# Patient Record
Sex: Male | Born: 1935 | Race: White | Hispanic: No | Marital: Married
Health system: Southern US, Community
[De-identification: ages and names within clinical notes are randomized; demographics above are authoritative.]

---

## 2012-05-18 ENCOUNTER — Emergency Department: Payer: Self-pay | Admitting: Emergency Medicine

## 2012-05-18 LAB — COMPREHENSIVE METABOLIC PANEL
Albumin: 3.8 g/dL (ref 3.4–5.0)
Alkaline Phosphatase: 73 U/L (ref 50–136)
Anion Gap: 5 — ABNORMAL LOW (ref 7–16)
Bilirubin,Total: 0.6 mg/dL (ref 0.2–1.0)
Calcium, Total: 8.9 mg/dL (ref 8.5–10.1)
Chloride: 107 mmol/L (ref 98–107)
Co2: 28 mmol/L (ref 21–32)
EGFR (Non-African Amer.): >60
Glucose: 147 mg/dL — ABNORMAL HIGH (ref 65–99)
Osmolality: 284 (ref 275–301)
Potassium: 3.4 mmol/L — ABNORMAL LOW (ref 3.5–5.1)
SGPT (ALT): 29 U/L (ref 12–78)
Sodium: 140 mmol/L (ref 136–145)
Total Protein: 6.7 g/dL (ref 6.4–8.2)

## 2012-05-18 LAB — CBC WITH DIFFERENTIAL/PLATELET
Basophil #: 0 10*3/uL (ref 0.0–0.1)
Eosinophil #: 0.3 10*3/uL (ref 0.0–0.7)
Eosinophil %: 4.4 %
HCT: 36.7 % — ABNORMAL LOW (ref 40.0–52.0)
Lymphocyte #: 1.4 10*3/uL (ref 1.0–3.6)
Lymphocyte %: 24.1 %
MCHC: 35.1 g/dL (ref 32.0–36.0)
Neutrophil %: 62.8 %
Platelet: 176 10*3/uL (ref 150–440)
RBC: 4.07 10*6/uL — ABNORMAL LOW (ref 4.40–5.90)
RDW: 13.5 % (ref 11.5–14.5)

## 2012-05-18 LAB — CK TOTAL AND CKMB (NOT AT ARMC)
CK, Total: 131 U/L (ref 35–232)
CK-MB: 2.5 ng/mL (ref 0.5–3.6)

## 2012-05-18 LAB — PROTIME-INR: INR: 1.1

## 2012-05-18 LAB — TROPONIN I: Troponin-I: 1.3 ng/mL — ABNORMAL HIGH

## 2012-05-29 ENCOUNTER — Inpatient Hospital Stay: Payer: Self-pay | Admitting: Family Medicine

## 2012-05-29 LAB — URINALYSIS, COMPLETE
Bacteria: NONE SEEN
Bilirubin,UR: NEGATIVE
Blood: NEGATIVE
Glucose,UR: NEGATIVE mg/dL (ref 0–75)
Ketone: NEGATIVE
Nitrite: NEGATIVE
Ph: 5 (ref 4.5–8.0)
Protein: NEGATIVE
RBC,UR: 3 /HPF (ref 0–5)
Specific Gravity: 1.044 (ref 1.003–1.030)
Squamous Epithelial: NONE SEEN
WBC UR: 4 /HPF (ref 0–5)

## 2012-05-29 LAB — COMPREHENSIVE METABOLIC PANEL
Albumin: 3.4 g/dL (ref 3.4–5.0)
Alkaline Phosphatase: 77 U/L (ref 50–136)
BUN: 13 mg/dL (ref 7–18)
Bilirubin,Total: 0.4 mg/dL (ref 0.2–1.0)
EGFR (Non-African Amer.): >60
Osmolality: 283 (ref 275–301)
Potassium: 3.8 mmol/L (ref 3.5–5.1)
SGPT (ALT): 41 U/L (ref 12–78)

## 2012-05-29 LAB — CBC
HCT: 36.8 % — ABNORMAL LOW (ref 40.0–52.0)
HGB: 13.2 g/dL (ref 13.0–18.0)
MCH: 32.5 pg (ref 26.0–34.0)
MCHC: 35.9 g/dL (ref 32.0–36.0)

## 2012-05-29 LAB — CK TOTAL AND CKMB (NOT AT ARMC)
CK, Total: 134 U/L (ref 35–232)
CK, Total: 215 U/L (ref 35–232)
CK-MB: 3.3 ng/mL (ref 0.5–3.6)
CK-MB: 7.4 ng/mL — ABNORMAL HIGH (ref 0.5–3.6)

## 2012-05-29 LAB — PROTIME-INR
INR: 1
Prothrombin Time: 13.5 secs (ref 11.5–14.7)

## 2012-05-29 LAB — TROPONIN I
Troponin-I: 1.2 ng/mL — ABNORMAL HIGH
Troponin-I: 1.32 ng/mL — ABNORMAL HIGH

## 2012-05-30 LAB — LIPID PANEL
Cholesterol: 83 mg/dL (ref 0–200)
HDL Cholesterol: 22 mg/dL — ABNORMAL LOW (ref 40–60)
Ldl Cholesterol, Calc: 42 mg/dL (ref 0–100)
VLDL Cholesterol, Calc: 19 mg/dL (ref 5–40)

## 2012-05-30 LAB — CK TOTAL AND CKMB (NOT AT ARMC)
CK, Total: 177 U/L (ref 35–232)
CK-MB: 5.8 ng/mL — ABNORMAL HIGH (ref 0.5–3.6)

## 2012-05-31 LAB — CBC WITH DIFFERENTIAL/PLATELET
Eosinophil #: 0.2 10*3/uL (ref 0.0–0.7)
Lymphocyte #: 2.1 10*3/uL (ref 1.0–3.6)
Lymphocyte %: 24.8 %
MCHC: 36 g/dL (ref 32.0–36.0)
MCV: 90 fL (ref 80–100)
Monocyte %: 7.4 %
Neutrophil #: 5.4 10*3/uL (ref 1.4–6.5)
Neutrophil %: 64.9 %
RDW: 13.4 % (ref 11.5–14.5)
WBC: 8.4 10*3/uL (ref 3.8–10.6)

## 2012-05-31 LAB — BASIC METABOLIC PANEL
Anion Gap: 6 — ABNORMAL LOW (ref 7–16)
Calcium, Total: 8.9 mg/dL (ref 8.5–10.1)
Chloride: 106 mmol/L (ref 98–107)
Co2: 28 mmol/L (ref 21–32)
Creatinine: 1.08 mg/dL (ref 0.60–1.30)
EGFR (African American): >60
Glucose: 90 mg/dL (ref 65–99)
Sodium: 140 mmol/L (ref 136–145)

## 2012-06-05 ENCOUNTER — Encounter: Payer: Self-pay | Admitting: Family Medicine

## 2012-06-16 ENCOUNTER — Encounter: Payer: Self-pay | Admitting: Family Medicine

## 2013-07-31 IMAGING — CT CT HEAD WITHOUT CONTRAST
3 series · 17 of 30 positions shown, 19 images · non-contrast
Comparison: none

REASON FOR EXAM: slurred speech
COMMENTS:

[Series 2: soft tissue · axial · 0.43mm/px · z∈[+1300,+1430]mm · 8 of 34 slices shown, 10 images (1 of 2)]
[im 4/34  brain]
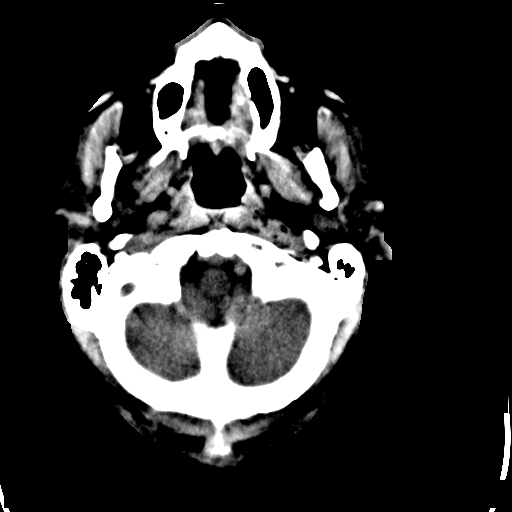
[im 4/34  bone]
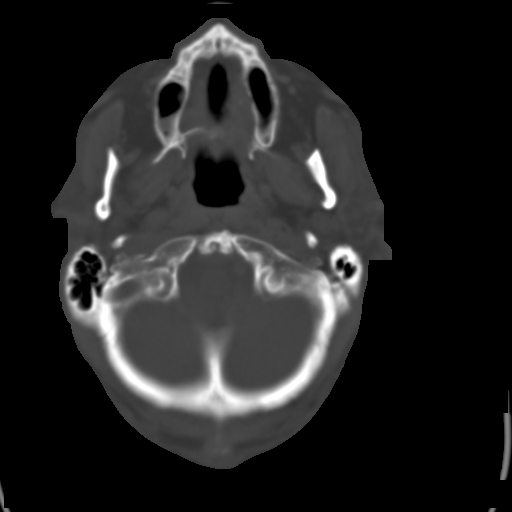
[im 8/34  brain]
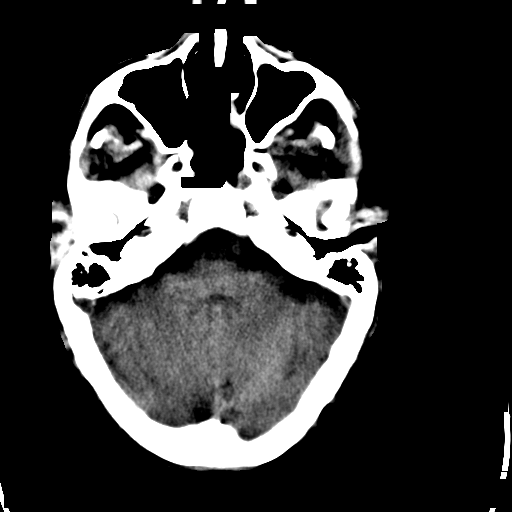
[im 12/34  brain]
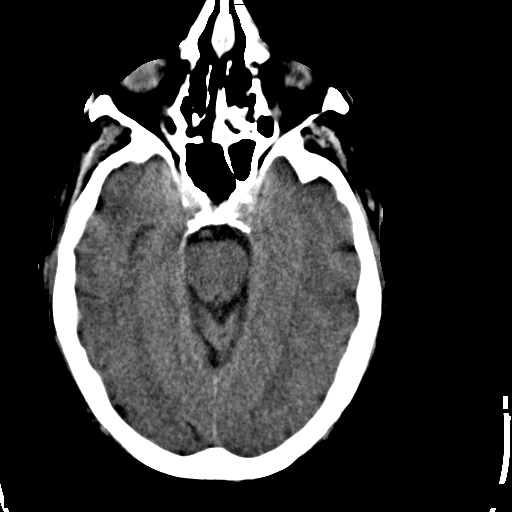
[im 15/34  brain]
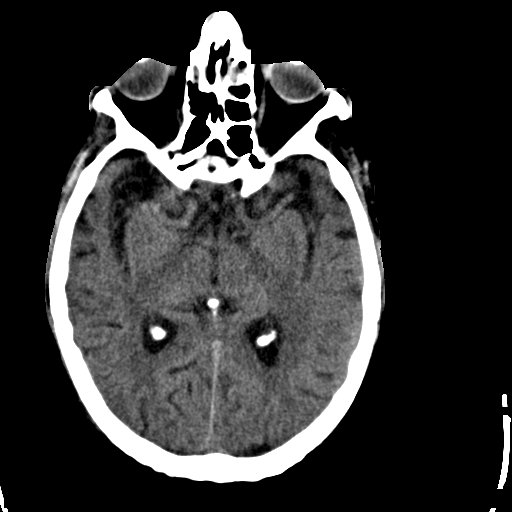
[im 19/34  brain]
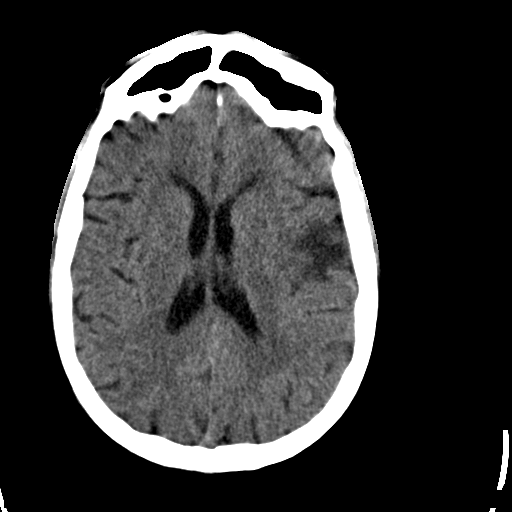
[im 19/34  bone]
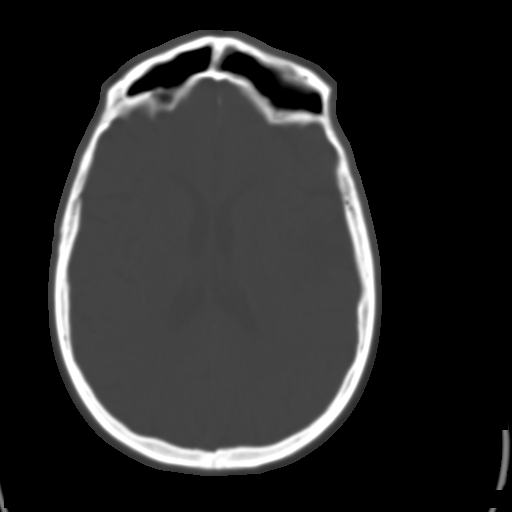
[im 23/34  brain]
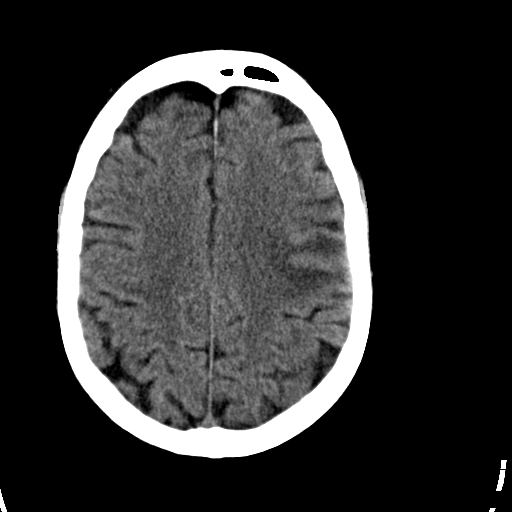
[im 26/34  brain]
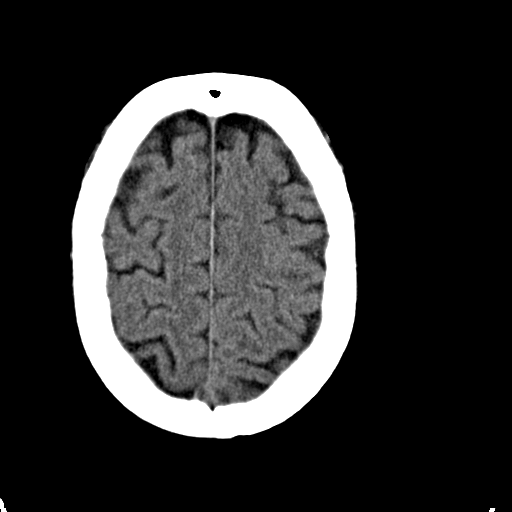
[im 30/34  brain]
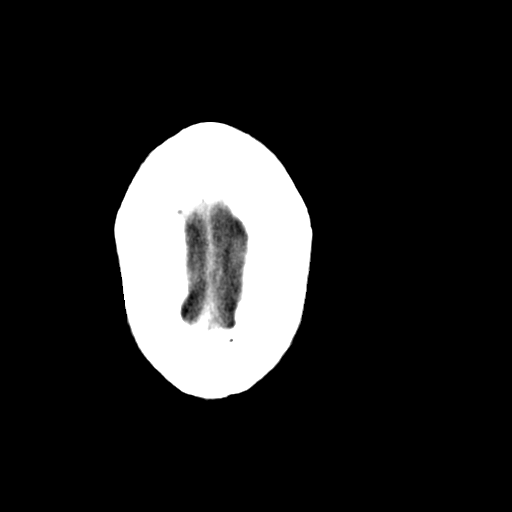

[Series 4: soft tissue · axial · 0.43mm/px · z∈[+1377,+1470]mm · 7 of 35 slices shown (2 of 2)]
[im 4/35  brain]
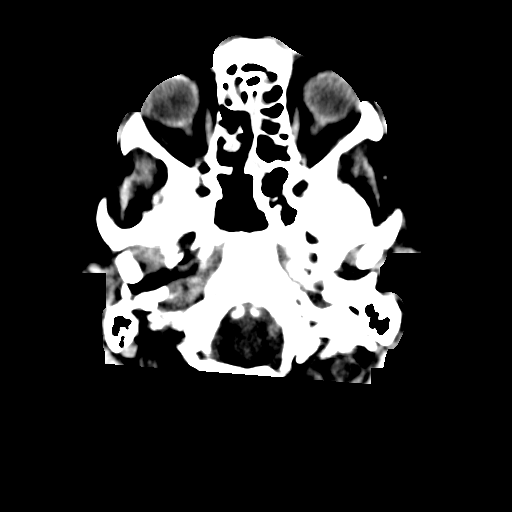
[im 8/35  brain]
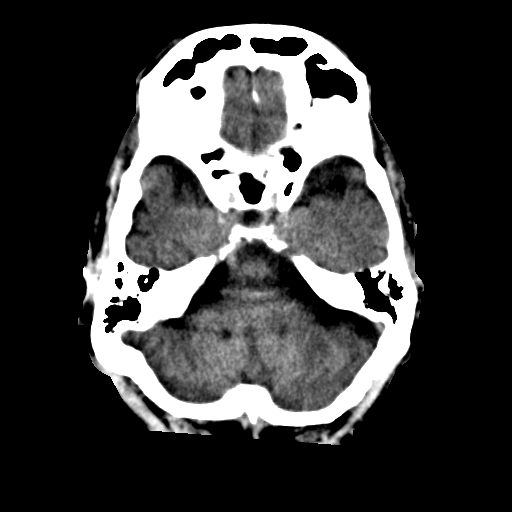
[im 12/35  brain]
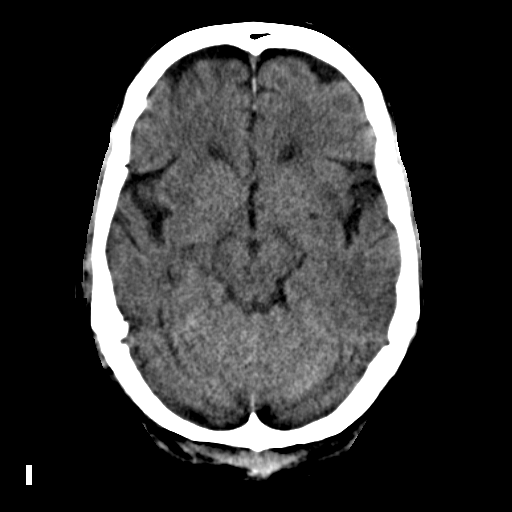
[im 16/35  brain]
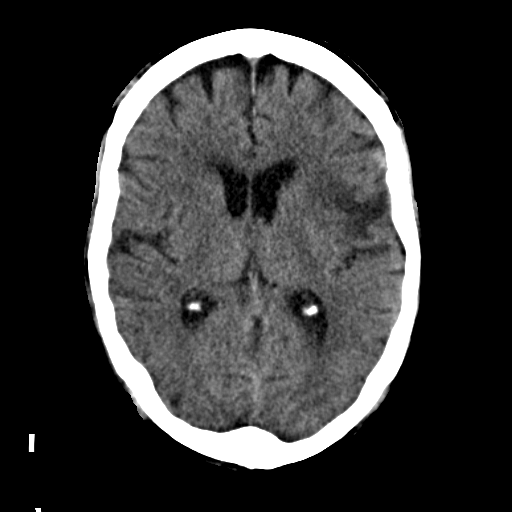
[im 19/35  brain]
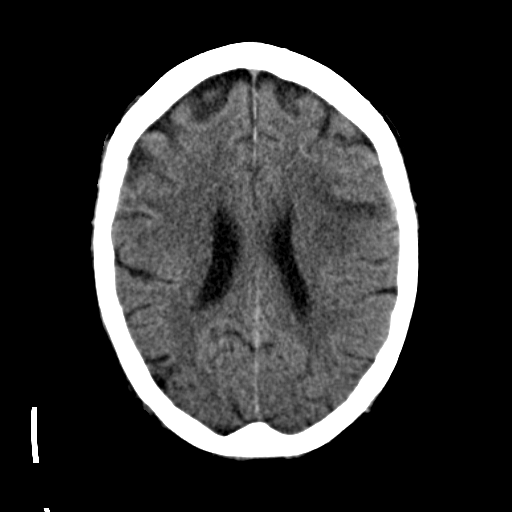
[im 23/35  brain]
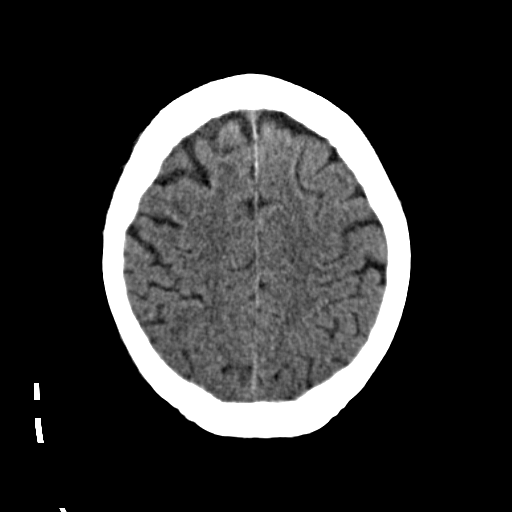
[im 27/35  brain]
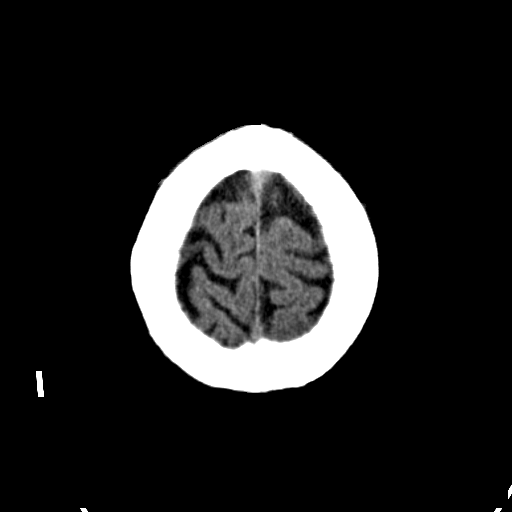

[Series 5: bone · axial · 0.43mm/px · z∈[+1383,+1400]mm · 2 of 34 slices shown]
[im 4/34  bone]
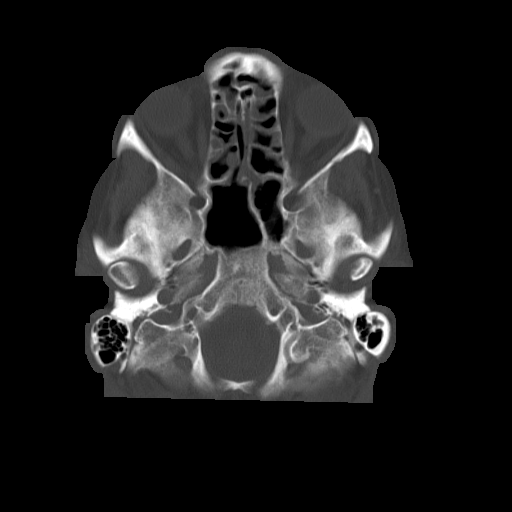
[im 8/34  bone]
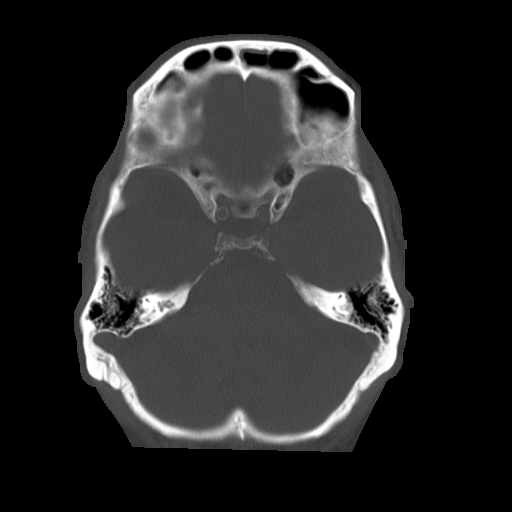

[17 of 30 positions shown; findings below may reference images not displayed]

PROCEDURE:     CT  - CT HEAD WITHOUT CONTRAST  - May 29, 2012  [DATE]

RESULT:     Axial CT scanning was performed through the brain with
reconstructions at 5 mm intervals and slice thicknesses. Comparison is made
to the study May 18, 2012.

There is abnormal hypodensity in the left posterior frontal and anterior
parietal lobes with a small amount of abnormal density extending inferiorly
into the upper aspect of the left temporal lobe. The findings are new and
are consistent with an evolving ischemic infarction. There is no
intracranial hemorrhage. There is no shift of the midline. The ventricles
are normal in size. I do not see similar findings elsewhere within the
brain. The cerebellum and brainstem exhibit no acute abnormalities.

At bone window settings there is mucoperiosteal thickening within the
ethmoid inferior aspect of the right maxillary sinus consistent with
inflammation. There is no evidence of an acute skull fracture.
IMPRESSION: 1. There is abnormal hypodensity present in the posterior frontal-anterior
parietal lobes on the left. The findings are worrisome for an evolving
ischemic infarction. The findings are new since May 18, 2012. There is no
evidence of an intracranial hemorrhage.
2. Mild inflammation in the ethmoid and right maxillary sinuses is
suspected.

[REDACTED]

## 2013-08-01 IMAGING — CT CT HEAD WITHOUT CONTRAST
1 series · 15 of 30 positions shown, 19 images · non-contrast
Comparison: none

REASON FOR EXAM: f/u CVA. Cannot get MRI due to PM.
COMMENTS:

PROCEDURE:     CT  - CT HEAD WITHOUT CONTRAST  - May 30, 2012  [DATE]
RESULT:     Comparison is made to a prior study dated 05/29/2012.
TECHNIQUE: Helical noncontrasted 5 mm sections were obtained from the skull
base through the vertex.

[Series 2: soft tissue · axial · 0.44mm/px · z∈[+394,+528]mm · 15 of 30 slices shown, 19 images]
[im 2/30  brain]
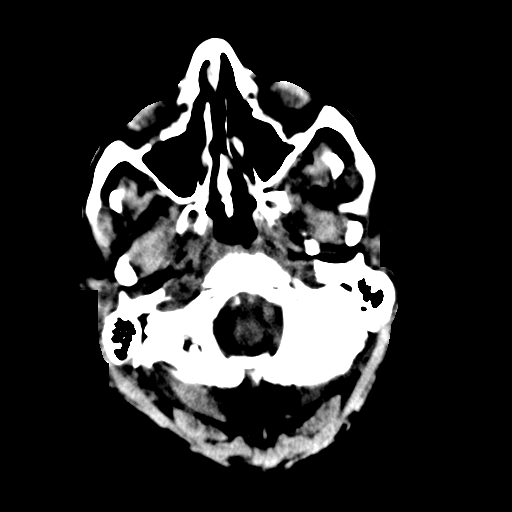
[im 2/30  bone]
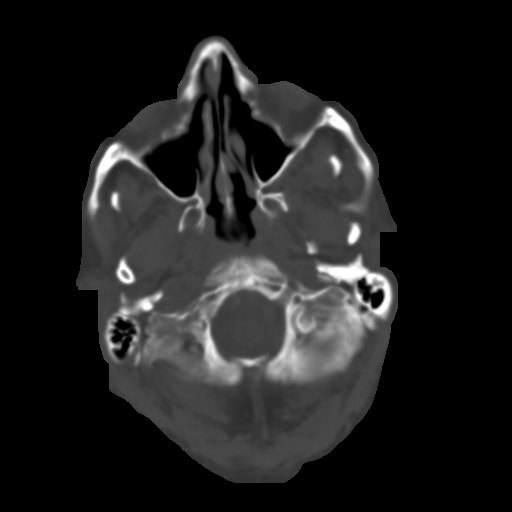
[im 4/30  brain]
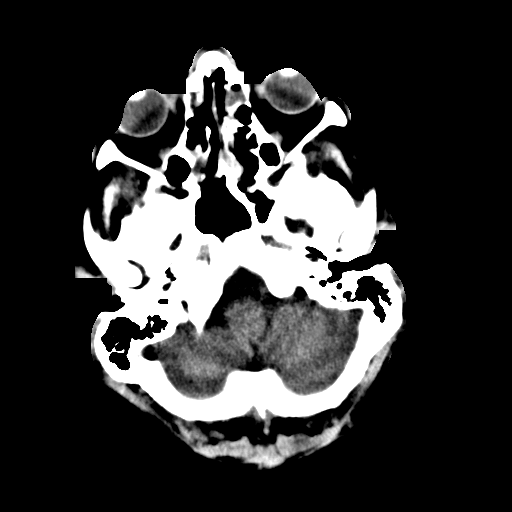
[im 6/30  brain]
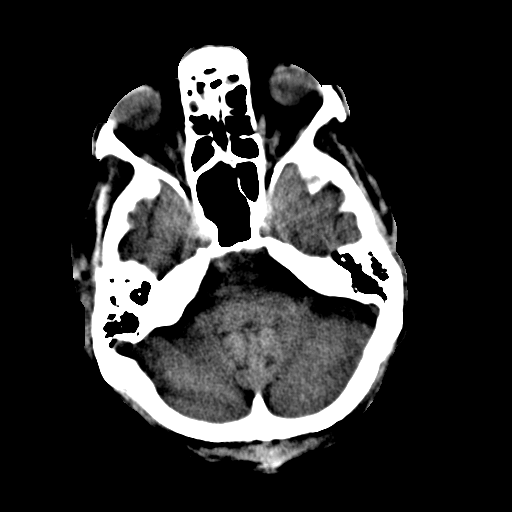
[im 8/30  brain]
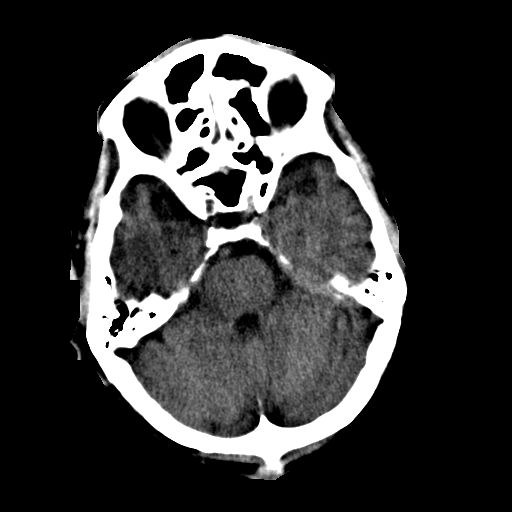
[im 10/30  brain]
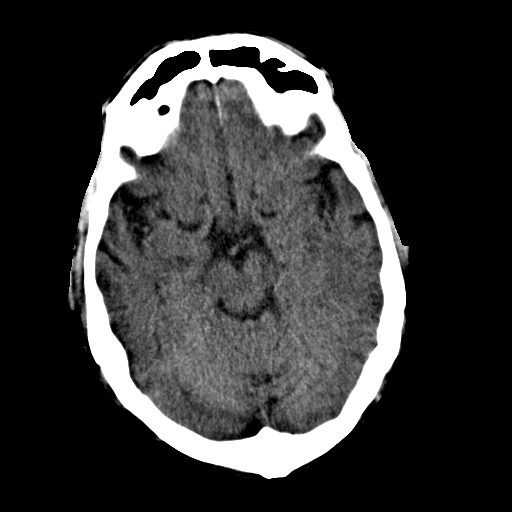
[im 10/30  bone]
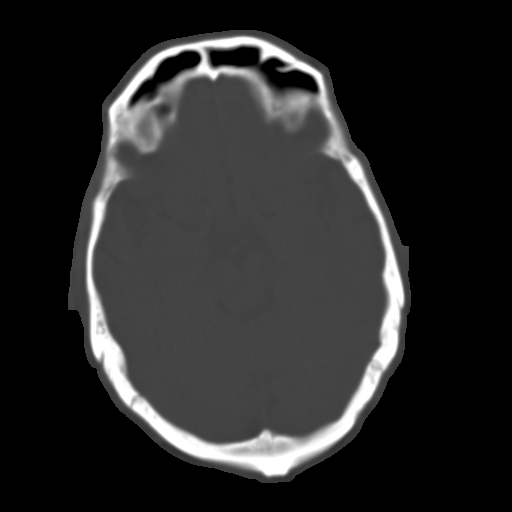
[im 12/30  brain]
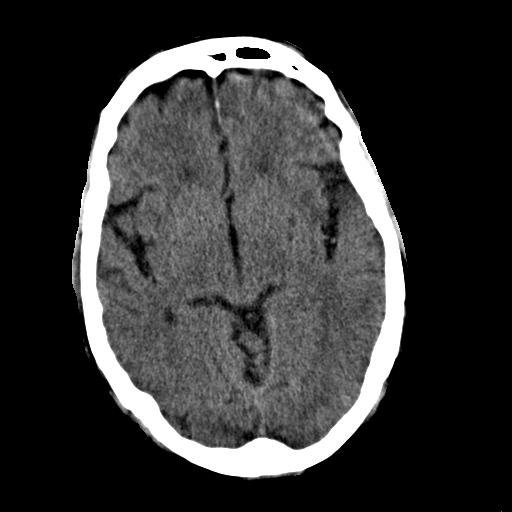
[im 14/30  brain]
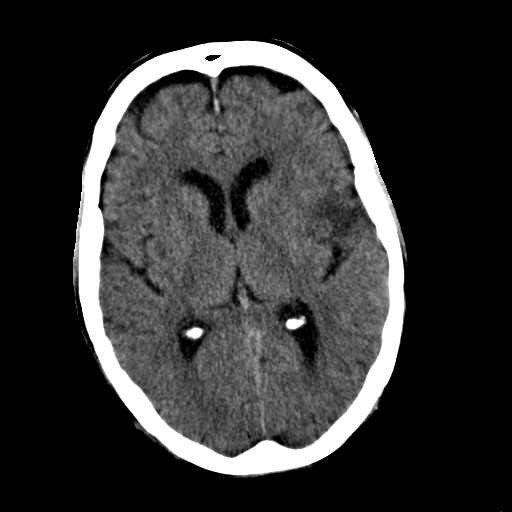
[im 16/30  brain]
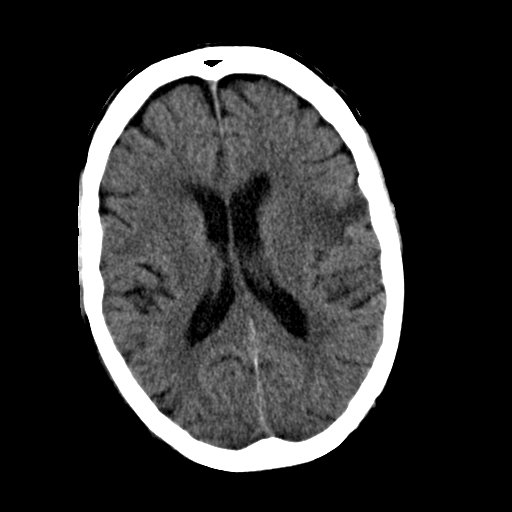
[im 17/30  brain]
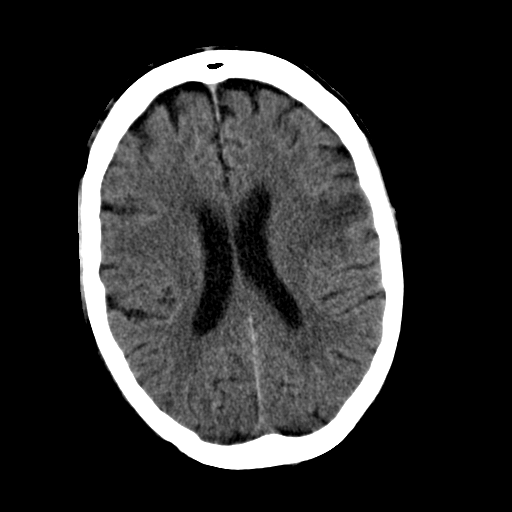
[im 17/30  bone]
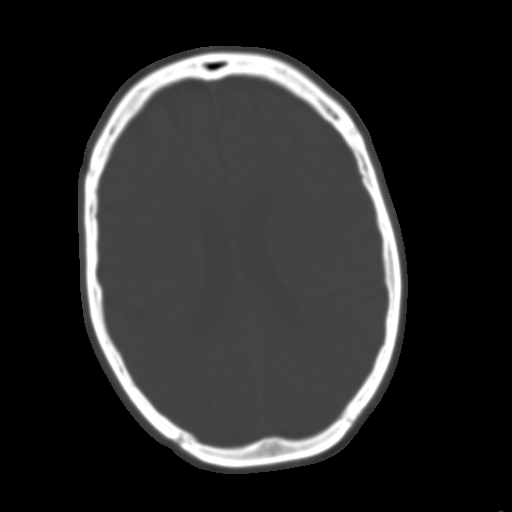
[im 19/30  brain]
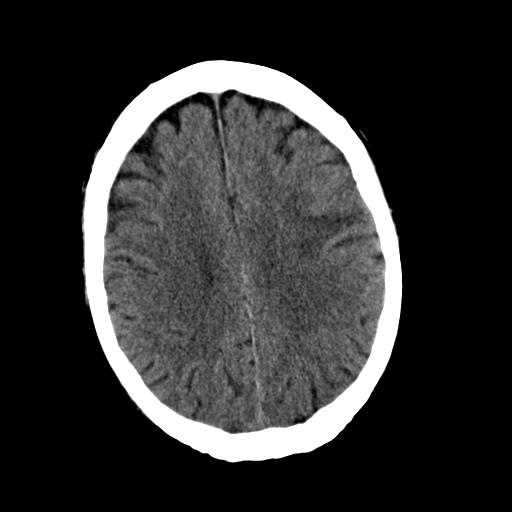
[im 21/30  brain]
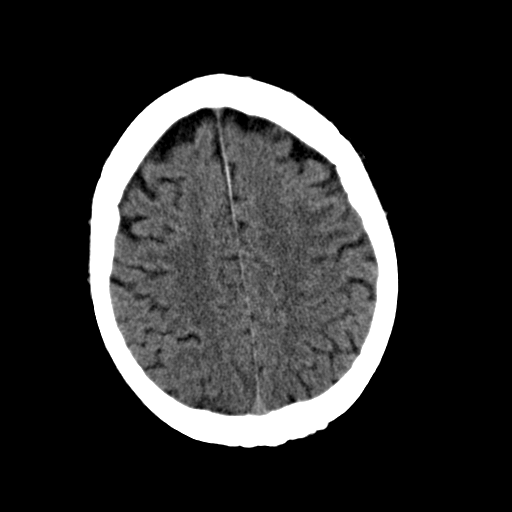
[im 23/30  brain]
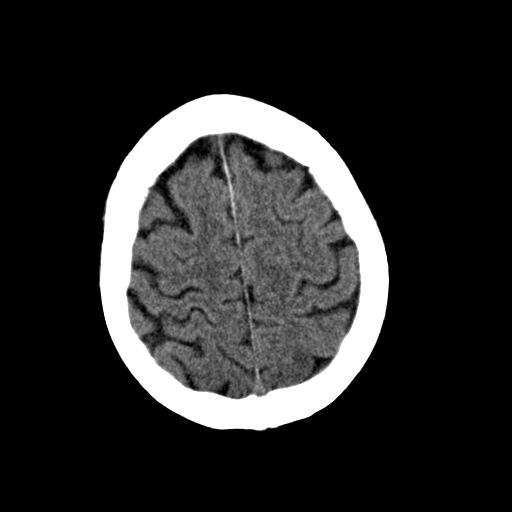
[im 25/30  brain]
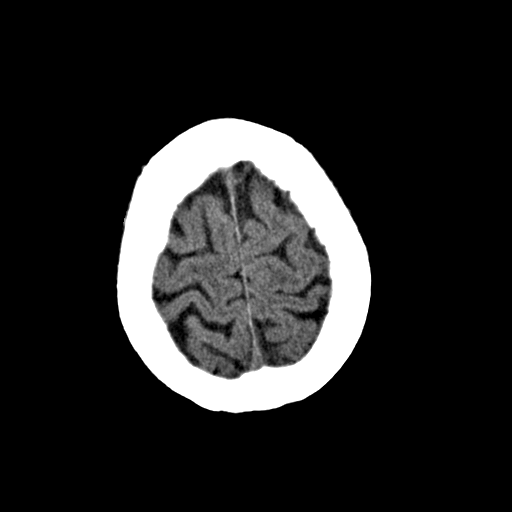
[im 25/30  bone]
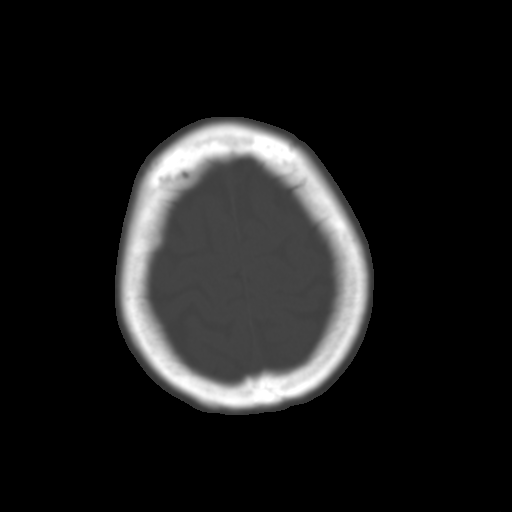
[im 27/30  brain]
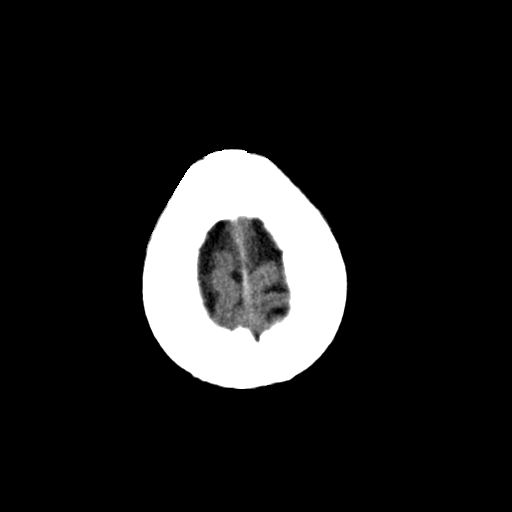
[im 29/30  brain]
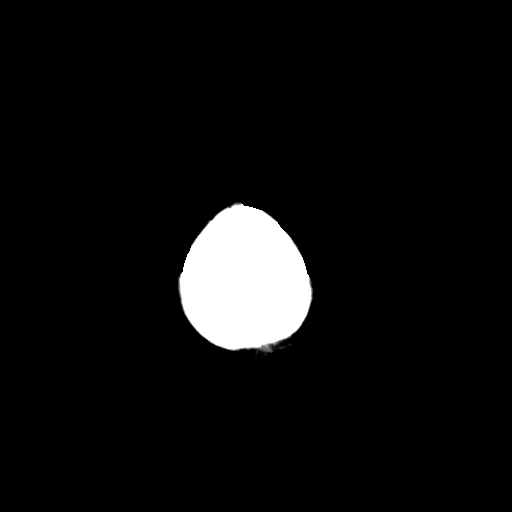

[15 of 30 positions shown; findings below may reference images not displayed]

FINDINGS: The previously described area of hypodensity within the posterior
aspect of the frontal lobe extending into the parietal region is unchanged.
Diffuse areas of low-attenuation project within the subcortical, deep, and
periventricular white matter regions, mild. There is no evidence of
subfalcine or tonsillar herniation. There is mild diffuse cortical and
cerebellar atrophy. No intra-axial nor extra-axial fluid collections are
identified nor evidence of acute hemorrhage. The osseous structures
demonstrate no evidence of a depressed skull fracture. There is mucosal
thickening and areas of opacification in the ethmoid air cells. The mastoid
air cells are patent.
IMPRESSION: Chronic and involutional changes without evidence of acute
abnormalities.

## 2014-05-08 NOTE — H&P (Signed)
PATIENT NAME:  Kurt Shepherd, Kurt Shepherd MR#:  [id number] DATE OF BIRTH:  10-Jun-1935  DATE OF ADMISSION:  05/29/2012  PRIMARY CARE PHYSICIAN: At [facility].  The family does not remember primary care physician's name.  CHIEF COMPLAINT: Bilateral lower extremity weakness and confusion.   HISTORY OF PRESENTING ILLNESS: A 79 year old Caucasian male patient with history of congestive heart failure, automatic implantable cardiac defibrillator pacemaker, coronary artery disease, diabetes, hypertension, who had a recent stroke on 05/18/2012 with aphasia and right-sided weakness who was transferred from [facility] Room to [facility] at family's request. The patient was worked up and discharged home on 05/06 with aphasia and outpatient speech therapy. The patient's right-sided weakness had resolved close to normal by the time of discharge. The family mentions that they had requested a transfer to a skilled nursing facility, but the patient was discharged home as he did not qualify for rehab with normal motor function.   Today the patient woke up with significant weakness in his lower extremities and confusion. The patient did have aphasia from his recent stroke, which seems to be unchanged but the patient presently also has new dysphagia.   The patient's CT scan from 05/18/2012 showed no acute stroke, but today there is a stroke involving left-sided temporal parietal and frontal areas read as evolving stroke by radiology. Although his CT scans from [facility] from the recent admission are not available at this time waiting on records. The patient has an AICD pacemaker and cannot get an MRI.   History could not be obtained from the patient as he has aphasia. Old records have been reviewed, and history obtained from patient's family.   PAST MEDICAL HISTORY: 1.  Recent acute CVA on 05/18/2012.  2.  Congestive heart failure.  3.  Diabetes mellitus.  4.  Hypertension.  5.  Status post pacemaker/AICD.   6.  Surgery for nephrolithiasis.   SOCIAL HISTORY: The patient used to chew tobacco until his recent stroke. No alcohol. No illicit drugs. The patient lives with his wife and was independent and had an active lifestyle until his recent stroke.   CODE STATUS: Full code.   FAMILY HISTORY: No family history of strokes, coronary artery disease or cerebral aneurysm.   REVIEW OF SYSTEMS: Unobtainable as the patient has expressive aphasia. Please see history of present illness.   ALLERGIES:  PENICILLIN WHICH CAUSES HIVES.  HOME MEDICATIONS: 1.  Atorvastatin 80 mg oral once a day.  2.  Digoxin 01.25 mg oral once a day.  3.  Enalapril 10 mg 3 tablets oral 2 times a day.  4.  Flonase 50 mcg nasal once a day.  5.  Lasix 40 mg oral once a day.  6.  Glimepiride2 mg gram oral once a day.  7.  Omeprazole 20 mg oral once a day.  8.  Temazepam 30 mg oral once a day at bedtime for sleep.  9.  Aspirin 81 mg oral once a day.  10.  Coreg 25 mg oral 2 times a day. 11.  Claritin 10 mg oral once a day as needed for allergies.   PHYSICAL EXAMINATION: VITAL SIGNS: Temperature 98.5, pulse of 72, blood pressure 187/104 with saturations of 96% on room air.  GENERAL: Obese, Caucasian male patient lying in bed, restless, seems a little confused, aphasic. PSYCHIATRIC: Alert.  Orientation cannot be tested. Anxious.  HEENT: Atraumatic, normocephalic. Oral mucosa moist and pink. External ears and nose normal. No pallor. No icterus. Pupils bilaterally equal and reactive to  light.  NECK: Supple. No thyromegaly. No palpable lymph nodes. Trachea midline. No carotid bruit or JVD.   CARDIOVASCULAR: S1, S2 heard, a systolic murmur. No edema. Peripheral pulses 2+.  RESPIRATORY: Normal work of breathing. Clear to auscultation on both sides.  GASTROINTESTINAL: Soft abdomen, nontender. Bowel sounds present. No hepatosplenomegaly palpable.  SKIN: Warm and dry. No petechiae, rash, ulcers.  GENITOURINARY: No CVA tenderness or  bladder distention.  MUSCULOSKELETAL: No joint swelling, redness, effusion of the large joints. Normal muscle tone.  NEUROLOGICAL: Motor strength 5/5 in upper extremities and  4+/5 in the left lower extremity and 4/5 in right lower extremity. Babinski upgoing on the right side. Cerebellar function intact. He has right-sided facial droop and expressive aphasia. Follows commands.  LYMPHATIC: No cervical lymphadenopathy.   LABORATORY AND DIAGNOSTIC DATA:  Glucose 125, BUN 13, creatinine 0.99, sodium 141, potassium 3.8, chloride 109. AST, ALT, alkaline phosphatase, bilirubin normal. CK is 134. Troponin 1.2. WBC 4.8, hemoglobin 13.2, platelets 190, INR of 1.  EKG shows a paced rhythm.   CT scan of the head without contrast shows abnormal hypodensity present in the posterior, frontal, anterior, parietal lobes on the left worrisome for evolving ischemic stroke. Findings are new since 05/03.  Mild inflammation in the ethmoid and right maxillary sinus.   ASSESSMENT AND PLAN:   1.  The patient is a 79 year old male patient with an evolving left-sided frontal parietal temporal stroke who had a recent stroke with worsening symptoms. The patient is critically ill with new stroke.  Neurologic symptoms are beyond the 3-hour window. The family has requested that the patient be admitted [facility]. The patient cannot get an MRI secondary to with the pacemaker. We will get a repeat CT of scan of the head in 24 hours. We will also get a CTA of head and neck to look for any arterial occlusions and narrowing which may need intervention. Consult neurology, Dr. Sherryll BurgerShah, neuro checks q.4 hours. The patient will be on fall precautions and ambulate with assist. We will start the patient on Plavix in addition to his aspirin, check a fasting lipid profile. Continue the atorvastatin.  We will hold his blood pressure medications. Let his blood pressure run on elevated secondary to the acute  evolving stroke. I have explained to the family that if any intervention is needed which is not available at [facility] the [facility] and they are in agreement. We will have physical therapy occupational therapy and speech therapy see the patient.  The patient will be nothing by mouth secondary to his dysphagia until he gets a speech evaluation. [facility]  2.  Elevated troponin likely secondary from the stroke. The patient has a paced rhythm on EKG, did have elevated troponin during his recent visit to the ER with his previous stroke, MB is negative. We will consult cardiology to see the patient in case the patient is having a non-ST elevation myocardial infarction, although I doubt.  The patient is on aspirin, statin and beta blocker, Coreg.  3.  Hypertension, elevated secondary to the stroke.  4.  Hyperlipidemia. Check fasting lipid profile. Continue atorvastatin.  5.  Congestive heart failure. The patient does not seem fluid overloaded; we will hold his Lasix at this time and as the patient is n.p.o. and watch for any fluid overload.  6.  Deep vein thrombosis prophylaxis with Lovenox. No bleed found on  the CT scan of the head.  7.  CODE STATUS: Full code.   Critical care time spent today on this case with acute evolving stroke was 50 minutes.    ____________________________ Molinda Bailiff Hai Grabe, MD srs:ct D: 05/29/2012 11:38:30 ET T: 05/29/2012 12:22:17 ET JOB#: [id number]  cc: Wardell Heath R. Nasario Czerniak, MD, <Dictator> [facility] K. Sherryll Burger, MD  Orie Fisherman MD ELECTRONICALLY SIGNED 05/29/2012 14:03

## 2014-05-08 NOTE — Consult Note (Signed)
General Aspect Kurt Shepherd is a 79yo male with PMHx s/f chronic systolic CHF (s/p ICD), CAD, type 2 DM, HTN and recent CVA on 05/18/12 manifested as aphasia and R-sided weakness who re-presented today with worsening R-sided weakness and dysphasia.   He was transferred from [facility] to [facility] at the request of the family for further work-up. Troponin-I at that time was 1.30. Head CT revealed no evidence of ischemia. He was diagnosed with CVA and discharged from [facility] last week. This morning, he had worsened R-sided weakness, slurred speech and facial droop. Repeat CT scan in the ED today reveals new left-sided frontal and parietal lobe ischemia. Troponin-I 1.20. Cardiology has been consulted for elevated troponin-I to exclude NSTEMI.   Present Illness He denies recent exertional chest pain. He does have chronic SOB (able to work in the yard in 15-minute intervals before becoming dyspneic). No worsening DOE, LE edema, PND, orthopnea, weight increase, new cough, palpitations, lightheadedness or syncope. He has had a cardiac catheterization > 10 years ago which did reveal CAD. No PCI performed per family. He has had stress testing over 5 years ago which was "normal." He denies history of arrhythmias. He follows up regularly with a cardiologist at [facility]. Suspect ICD is Bi-V reviewing A-V paced EKG.   PAST MEDICAL HISTORY: 1.  Recent acute CVA on 05/18/2012.  2.  Congestive heart failure.  3.  Diabetes mellitus.  4.  Hypertension.  5.  Status post pacemaker/AICD.  6.  Surgery for nephrolithiasis.   SOCIAL HISTORY: The patient used to chew tobacco until his recent stroke. No alcohol. No illicit drugs. The patient lives with his wife and was independent and had an active lifestyle until his recent stroke.   FAMILY HISTORY: No family history of strokes, coronary artery disease or cerebral aneurysm.   Physical Exam:  GEN no acute distress, thin   HEENT PERRL, poor dentition   NECK supple  No masses  trachea  midline   RESP normal resp effort  clear BS  no use of accessory muscles   CARD Regular rate and rhythm  Normal, S1, S2  No murmur   ABD soft  normal BS   EXTR negative cyanosis/clubbing, negative edema   SKIN normal to palpation   NEURO follows commands, dysphasic, diffuse incoordination, R-sided weakness   PSYCH alert, A+O to time, place, person   Review of Systems:  Subjective/Chief Complaint weakness, dysphasia, incoordination   General: Weakness   Respiratory: No Complaints   Cardiovascular: No Complaints   Neurologic: dysphasia, weakness, incoordination     s/p ICD:    CAD:    CVA May 3rd 2014:    DM:    HTN:    CHF:   Home Medications: Medication Instructions Status  digoxin 125 mcg (0.125 mg) oral tablet 1 tab(s) orally once a day Active  temazepam 30 mg oral capsule 1 cap(s) orally once a day (at bedtime), As Needed - for sleep Active  glimepiride 2 mg oral tablet 1 tab(s) orally once a day Active  furosemide 40 mg oral tablet 1 tab(s) orally once a day Active  carvedilol 25 mg oral tablet 1 tab(s) orally 2 times a day Active  atorvastatin 80 mg oral tablet 1 tab(s) orally once a day (at bedtime) Active  Flonase 50 mcg/inh nasal spray 1 spray(s) nasal once a day Active  Claritin 10 mg oral tablet 1 tab(s) orally once a day, As Needed for allergies Active  omeprazole 20 mg oral delayed release capsule 1  cap(s) orally once a day Active  enalapril 10 mg oral tablet 3 tab(s) orally 2 times a day Active  aspirin 81 mg oral delayed release tablet 1 tab(s) orally once a day Active   Lab Results:  Hepatic:  14-May-14 08:42   Bilirubin, Total 0.4  Alkaline Phosphatase 77  SGPT (ALT) 41  SGOT (AST) 27  Total Protein, Serum 6.4  Albumin, Serum 3.4  Routine Chem:  14-May-14 08:42   Glucose, Serum  125  BUN 13  Creatinine (comp) 0.99  Sodium, Serum 141  Potassium, Serum 3.8  Chloride, Serum  109  CO2, Serum 27  Calcium (Total), Serum 8.7   Osmolality (calc) 283  eGFR (African American) >60  eGFR (Non-African American) >60 (eGFR values <59m/min/1.73 m2 may be an indication of chronic kidney disease (CKD). Calculated eGFR is useful in patients with stable renal function. The eGFR calculation will not be reliable in acutely ill patients when serum creatinine is changing rapidly. It is not useful in  patients on dialysis. The eGFR calculation may not be applicable to patients at the low and high extremes of body sizes, pregnant women, and vegetarians.)  Anion Gap  5  Result Comment TROPONIN - RESULTS VERIFIED BY REPEAT TESTING.  - CALLED RESULT TO DENIA ROYSTER AT  - [id number]  - READ-BACK PROCESS PERFORMED.  Result(s) reported on 29 May 2012 at 09:39AM.  Cardiac:  14-May-14 08:42   CK, Total 134  CPK-MB, Serum 3.3 (Result(s) reported on 29 May 2012 at 09:32AM.)  Troponin I  1.20 (0.00-0.05 0.05 ng/mL or less: NEGATIVE  Repeat testing in 3-6 hrs  if clinically indicated. >0.05 ng/mL: POTENTIAL  MYOCARDIAL INJURY. Repeat  testing in 3-6 hrs if  clinically indicated. NOTE: An increase or decrease  of 30% or more on serial  testing suggests a  clinically important change)  Routine UA:  14-May-14 14:01   Color (UA) Yellow  Clarity (UA) Clear  Glucose (UA) Negative  Bilirubin (UA) Negative  Ketones (UA) Negative  Specific Gravity (UA) 1.044  Blood (UA) Negative  pH (UA) 5.0  Protein (UA) Negative  Nitrite (UA) Negative  Leukocyte Esterase (UA) Trace (Result(s) reported on 29 May 2012 at 02:24PM.)  RBC (UA) 3 /HPF  WBC (UA) 4 /HPF  Bacteria (UA) NONE SEEN  Epithelial Cells (UA) NONE SEEN  Mucous (UA) PRESENT (Result(s) reported on 29 May 2012 at 02:24PM.)  Routine Coag:  14-May-14 08:42   Prothrombin 13.5  INR 1.0 (INR reference interval applies to patients on anticoagulant therapy. A single INR therapeutic range for coumarins is not optimal for all indications; however, the suggested range for  most indications is 2.0 - 3.0. Exceptions to the INR Reference Range may include: Prosthetic heart valves, acute myocardial infarction, prevention of myocardial infarction, and combinations of aspirin and anticoagulant. The need for a higher or lower target INR must be assessed individually. Reference: The Pharmacology and Management of the Vitamin K  antagonists: the seventh ACCP Conference on Antithrombotic and Thrombolytic Therapy. CBTDVV.6160Sept:126 (3suppl): [id number] A HCT value >55% may artifactually increase the PT.  In one study,  the increase was an average of 25%. Reference:  "Effect on Routine and Special Coagulation Testing Values of Citrate Anticoagulant Adjustment in Patients with High HCT Values." American Journal of Clinical Pathology 2006;126:400-405.)  Routine Hem:  14-May-14 08:42   WBC (CBC) 4.8  RBC (CBC)  4.06  Hemoglobin (CBC) 13.2  Hematocrit (CBC)  36.8  Platelet Count (CBC) 190 (Result(s) reported on 29 May 2012 at 09:03AM.)  MCV 91  MCH 32.5  MCHC 35.9  RDW 13.7   EKG:  Interpretation A-paced, V-paced   EKG Comparision Not changed from  05/18/12   Radiology Results: CT:    14-May-14 08:51, CT Head Without Contrast  CT Head Without Contrast   REASON FOR EXAM:    slurred speech  COMMENTS:       PROCEDURE: CT  - CT HEAD WITHOUT CONTRAST  - May 29 2012  8:51AM     RESULT: Axial CT scanning was performed through the brain with   reconstructions at 5 mm intervals and slice thicknesses. Comparison is   made to the study of May 18, 2012.    There is abnormal hypodensity in the left posterior frontal and anterior   parietal lobes with a small amount of abnormal density extending   inferiorly into the upper aspect of the left temporal lobe. Thefindings   are new and are consistent with an evolving ischemic infarction. There is   no intracranial hemorrhage. There is no shift of the midline. The   ventricles are normal in size. I do not see similar  findings elsewhere     within the brain. The cerebellum and brainstem exhibit no acute   abnormalities.     At bone window settings there is mucoperiosteal thickening within the   ethmoid inferior aspect of the right maxillary sinus consistent with   inflammation. There is no evidence of an acute skull fracture.    IMPRESSION:    1. There is abnormal hypodensity present in the posterior   frontal-anterior parietal lobes on the left. The findings are worrisome   for an evolving ischemic infarction. The findings are new since May 18 2012. There is no evidence of an intracranial hemorrhage.  2. Mild inflammation in the ethmoid and right maxillary sinuses is   suspected.   Dictation Site: 1        Verified By: DAVID A. Martinique, M.D., MD    14-May-14 12:56, CT Angiography Head/Neck W/WO Combo  CT Angiography Head/Neck W/WO Combo   REASON FOR EXAM:    Acute cva. Worsening.  COMMENTS:       PROCEDURE: CT  - CT ANGIOGRAPHY HEAD NECK W/WO  - May 29 2012 12:56PM     RESULT: Indication: worsening acute cva    Comparisons: None    Technique: 100 ml of Isovue 370 was administered and the extracranial   carotid arteries bilaterally were scanned during the arterial phase from   the level of the superior portion of the frontal sinuses to the proximal   neck. These images were then transferred to the Siemens work station and   were subsequently reviewed utilizing 3-D reconstructions and MIP images.  Findings:     Intracranial circulation/circle-of-Willis:    There is low attenuation in the left parietal lobe most concerning for an   acute-subacute infarct.    Anterior: The internal carotid is identified and is patent bilaterally   from the skull base to the bifurcation into the middle cerebral and   anterior cerebral arteries. No posterior communicating arteries are   visualized. The anterior cerebral artery and anterior communicating   artery are normal.    Posterior: The vertebral  arteries are codominant and normal bilaterally   from the base of the skull to their confluence at the basilar artery. The     posterior cerebral arteries, superior cerebellar arteries, AICAs, and   PICAs are demonstrated and  are unremarkable.    Neck:    A three-vessel configuration of the aortic arch is identified with normal   ostium. The vertebral arteries are identified arising from the subclavian   arteries and entering the transverseforamen bilaterally at C6.     The carotid arteries are identified and are symmetric and unremarkable.   There is no evidence of aneurysm or carotid dissection bilaterally.     Right Carotid Artery: There is mild left heart plaque within the right   carotid bulb. There is no focal stenosis.  Left Carotid Artery: There is mild after sclerotic plaque within the left   carotid bulb without focal stenosis.    The visualized portions of the brain are unremarkable.     There is mild degenerative disc disease at C3-C4, C5-C6 and C6-C7.    The lung apices are clear.    IMPRESSION:     1. No carotid artery stenosis.    2. No intracranial stenosis.  3. Acute-subacute left parietal infarct.    Dictation Site: 1        Verified By: Jennette Banker, M.D., MD    Penicillin: Hives  Vital Signs/Nurse's Notes: **Vital Signs.:   14-May-14 14:32  Vital Signs Type Admission  Temperature Temperature (F) 98  Celsius 36.6  Temperature Source oral  Pulse Pulse 62  Respirations Respirations 18  Systolic BP Systolic BP 540  Diastolic BP (mmHg) Diastolic BP (mmHg) 90  Mean BP 118  Pulse Ox % Pulse Ox % 96  Pulse Ox Activity Level  At rest  Oxygen Delivery Room Air/ 21 %    Impression Kurt Shepherd is a 79yo male with PMHx s/f chronic systolic CHF (s/p ICD), CAD, type 2 DM, HTN and recent CVA on 05/18/12 manifested as aphasia and R-sided weakness who re-presented today with worsening R-sided weakness and dysphasia.   1. Acute left-sided CVA Evidenced on  head CT this admission. Further work-up per primary team. The patient denies history of arrhythmias. Could consider TEE to r/o thrombus with recurrent CVA and unknown EF. Recommend 2D echo as part of stroke work-up.  2. Elevated troponin Likely in the setting of CVA. The patient denies any new ischemic or CHF type symptoms. Per his and his family's report, he has been largely stable from a cardiac standpoint. Troponin was mildly elevated on previous admission, and given timing of excretion (7-10 days), has likely remained elevated since this time. Alternatively, patient's troponin may be chronically elevated.  3. Chronic systolic CHF s/p ICD A-paced, V-paced on EKG review favoring against underlying atrial fibrillation. No worsening SOB/DOE, PND, orthopnea, weight increase or LE edema.  -- Hold antihypertensives in the setting of acute CVA -- Consider rep interrogation to rule out undiagnosed, subclinical paroxysms of atrial fibrillation  4. CAD Reports prior cardiac catheterization and stress testing. Denies PCI. Do not suspect ACS as etiology of troponemia. Would allow patient to recover from cerebrovascular events, and follow-up with cardiologist to consider repeat stress testing. He has an appointment next month, but may need to be seen within 1-2 weeks post-discharge.  5. Hypertension Allowing for elevated BPs in the setting of acute CVA. Recommend adjusting outpatient antihypertensives to follow this.  6. Type 2 DM Management per primary team.   Plan Attending Note:  Agree with the above findings.   His Troponin elevation is mild .  I would not pursue further evaluation of that at this time.  I would interrogate the ICD .  If he is found to  have PAF, that would give [place] a possible explanation for his multiple strokes.   Electronic Signatures: Meriel Pica (PA-C)  (Signed 14-May-14 17:37)  Authored: General Aspect/Present Illness, History and Physical Exam, Review of System, Past Medical  History, Home Medications, Labs, EKG , Radiology, Allergies, Vital Signs/Nurse's Notes, Impression/Plan Nahser, Dreama Saa (MD)  (Signed 14-May-14 17:41)  Authored: Impression/Plan  Co-Signer: General Aspect/Present Illness, History and Physical Exam, Review of System, Past Medical History, Home Medications, Labs, EKG , Radiology, Allergies, Vital Signs/Nurse's Notes, Impression/Plan   Last Updated: 14-May-14 17:41 by Nahser, Dreama Saa (MD)

## 2014-05-08 NOTE — Discharge Summary (Signed)
PATIENT NAME:  Kurt Shepherd, Kurt Shepherd MR#:  [id number] DATE OF BIRTH:  11-26-1935  DATE OF ADMISSION:  05/29/2012 DATE OF DISCHARGE:  05/31/2012  REASON FOR ADMISSION: Bilateral lower extremity weakness and confusion.   PRIMARY CARE PHYSICIAN: At [facility].  CARDIOLOGIST: Dr. Elayne Guerin at [facility].   HISTORY OF PRESENT ILLNESS AND HOSPITAL COURSE: Kurt Shepherd is a nice 79 year old gentleman who has history of congestive heart failure with an automatic implantable cardiac defibrillator and a pacemaker, coronary artery disease, hypertension, diabetes, who was recently diagnosed with a stroke on 05/18/2012 with severe aphasia, right-sided weakness, and he was transferred from [facility] to [facility]. The patient had a workup done and discharged home. His main problem was aphasia,  right-sided weakness, and the patient was discharged home with outpatient speech therapy. The patient's right-sided weakness actually resolved by the time that he was discharged. The family wanted him to be transferred to a skilled nursing facility at that point, but he did not meet the criteria. On the day of admission on 05/29/2012, the patient woke up with significant weakness of his lower extremities and confusion. CT of the head showed no acute stroke on 05/18/2012, but on 05/29/2012 showed stroke involving the left side temporoparietal areas, read as evolving stroke by the radiologist. The patient was not able to get an MRI due to his pacemaker and AICD, for what we did a repeat CT scan. Repeat CT scan with CT angio showed normal hypodensity present in the posterior frontal, anterior parietal lobes of the left side. CT angio did not show any major problems. No carotid artery stenosis. No acute intracranial stenosis. Subacute left perineal infarct is seen. The patient was admitted for further evaluation. Cardiology and neurology were involved in the case. Neurology, Dr. Sherryll Burger, was concerned about possible paroxysmal  atrial fibrillation. He recommended to have interrogation of the AICD at some point and also consideration for a TEE. At this moment, the symptoms of the patient have resolved completely to baseline. He is able to walk around and has significant functioning. We think that this could be related to a TIA versus other problems (changes in blood pressure, maybe noncompliance with medications or so),  but the patient is back to his normal self, for what he is going to be discharged.   Interrogation of the pacemaker can be done as outpatient with Dr. Elayne Guerin, cardiology at [facility]. The patient was taking aspirin 81 mg a day. At this moment, he was  on Plavix. Dr. Sherryll Burger states that it could increase the risk of bleeding, but he was okay to leave it on as a short-term. I am going to let Dr. Sherryll Burger  stop this medication whenever he sees him out patient if that is what he wants.   Overall, the patient did well during this hospitalization. Did not  have any major changes. The changes of his mental status and weakness of the lower extremities again could be related to a TIA, as the symptoms resolved. EEG was done and failed to show any seizure activity.   Other medical problems were stable. As far as his blood pressure, his blood pressure remained stable during this hospitalization.   The elevated troponin was mildly elevated and it was secondary to  stroke.   Hyperlipidemia shows normal cholesterol, low LDL. Continue statin for future prevention.   Congestive heart failure. The patient did not have any major  problems. He has systolic and diastolic failure and he was compensated.  We stopped Lasix and blood pressure medications. Now it is fine to resume those medications.   As far as other results important to mention, the patient's chemistry was within normal limits. Hemoglobin A1c was 6.7. Potassium was 3.4 on admission, 3.6 at discharge. Troponin went up from 1.3 to 1.32 at the highest. Hemoglobin was 13.  White count was 8.4. Urinalysis was within normal limits.   DISCHARGE INSTRUCTIONS:  1.  The patient is to be discharged with followup with Dr. Elayne GuerinKirk Adams, [facility], Dr. Cristopher PeruHemang Shah or Dr. Malvin JohnsPotter in 2 to 4 weeks for followup on TIA.  2.  Consideration to  stopping or continuing Plavix for now has needs to be done outpatient.  3.  Interrogation of pacemaker outpatient by Dr. Pernell DupreAdams. 4.  [facility].   TIME SPENT: I spent about 45 minutes with this discharge.  ____________________________ Felipa Furnaceoberto Sanchez Gutierrez, MD [email or web address] D: 05/31/2012 14:33:43 ET T: 05/31/2012 21:55:32 ET JOB#: [id number]  cc: Felipa Furnaceoberto Sanchez Gutierrez, MD, <Dictator> Hemang K. Sherryll BurgerShah, MD Dr. Elayne GuerinKirk Adams Primary Care Physician, [facility] [place] Pearletha FurlOBERTO SANCHEZ GUTIERRE MD ELECTRONICALLY SIGNED 06/13/2012 21:25

## 2014-05-08 NOTE — Consult Note (Signed)
PATIENT NAME:  Kurt Shepherd, Kurt Shepherd MR#:  [id number] DATE OF BIRTH:  Aug 02, 1935  DATE OF CONSULTATION:  05/29/2012  REFERRING PHYSICIAN:  Srikar Shepherd. Sudini, MD CONSULTING PHYSICIAN:  Hemang K. Sherryll BurgerShah, MD  REASON FOR REFERRAL:  Recurrent stroke.  HISTORY OF PRESENT ILLNESS: Mr. Kurt Shepherd is a 79 year old Caucasian gentleman who had his first stroke on 05/18/2012 when he had acute onset of speech difficulty and right-sided weakness.  He came to the [facility] and got [facility] work[facility]up. The patient had a full stroke work-up over there and got discharged on 05/21/2012. Family requested inpatient rehab, but he did not qualify because his motor deficit resolved, and he was having mostly expressive aphasia.   On 05/29/2012, the patient woke up and might be more confused and had some weakness of his right side worse than his baseline. There was a concern for recurrent stroke, and the patient was brought to the [facility]. The patient got admitted for further evaluation.   PAST MEDICAL HISTORY: Significant for recent stroke, congestive heart failure, diabetes mellitus, hypertension, status post AICD placement, surgery for nephrolithiasis.   SOCIAL HISTORY: Significant that the patient used to chew tobacco until recent stroke. No alcohol, no drug use. The patient lives with his wife and was independent and was active.   FAMILY HISTORY: Significant for hypertension, diabetes but no stroke.   REVIEW OF SYSTEMS: Unobtainable.   ALLERGIES: HE IS ALLERGIC TO PENICILLIN, WHICH CAUSES HIVES.   MEDICATIONS: I reviewed his home medication list.   PHYSICAL EXAMINATION: VITAL SIGNS: Temperature was 98, pulse 70, respiratory rate 18, blood pressure 170/90, pulse oximetry 93%.  GENERAL: He is an elderly-looking Caucasian gentleman lying in bed, not in acute distress.   LUNGS: Clear to auscultation.  HEART: S1, S2 heart sounds. Carotid exam did not reveal any bruit.  NEUROLOGIC:  He was alert. He was not oriented. He had a very significant hypophonia, and it was very difficult for him to speak.  He seemed to have some dysarthria as well. He does seem to have some comprehension and some nonverbal communication.  As I entered the room, he smiled and extended his arm for me to shake.   He could not figure out how to show two fingers on either hand, but he was able to point to the ceiling. He had difficulty doing 2-step inverted commands. He had difficulty reading, but he can comprehend what he read.   The patient does not seem to have left-sided neurological neglect. His attention and concentration seems to be appropriate.   On his cranial nerves, his pupils were equal, round, and reactive. Extraocular movements were slow.  He has some restriction of the vertical gaze movement.   He does seem to have mild right lower facial weakness which was questionable.   His tongue was midline, but he has a significant restriction of the movement of his tongue at present.  His sensations were intact to light touch on his face. His hearing seems to be intact.   On his motor exam, he does seem to have mild pronator drift on the right compared to the left, but his strength was otherwise okay. His lower extremity strength also seems to be 5 out of 5, but he does seem to have some apraxia affecting his right arm as well as the right leg more than the left arm and left leg.   The patient's sensations were intact to light touch. He does not have sensory neglect  on double simultaneous stimulation. He did have some back and left right confusion, though. I did not check his gait.   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 The patient had a recent stroke work-up done at [facility], but I agree with repeating CT angiography  to look for any sign of intracranial stenosis which was not there. I agree with cardiology evaluation for need for possible TEE if his transthoracic echocardiogram does not show any thrombus. I agree with interrogating his ICD to make sure patient the does not have paroxysmal atrial fibrillation. Otherwise, he might benefit from anticoagulation with Coumadin or one of the newer generation anticoagulants.  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  As the patient has rapid improvement of his right-sided weakness, that is a definite diagnostic possibility; and we should to do the EEG to make sure he does not have any epileptiform discharges from that area. Right now, I will not start him on any antiepileptic medication until we have seen his repeat CT of the head. CT scan of the head is not a reliable study to look for small ischemic changes around a previous stroke region because penumbra and acute ischemia can look very similar on CT scan of the brain, but he cannot get MRI due to his AICD.   The patient has a good lipid panel but continues high-dose atorvastatin.  The patient and family should be aware of statin myopathy. Before this, the patient was on only aspirin 81 mg, and Plavix has been added, but there is no data to prove that dual antiplatelet therapy is any better than single antiplatelet therapy. Actually, it does increase the risk of intracranial hemorrhage, but for a short period of time we can continue that.   The patient should have aggressive PT, OT and speech therapy. Even though the patient has good strength on the right side, he has a significant apraxia which makes him have a significant functional deficit which I do believe benefits from physical and therapy. The patient should be considered for inpatient rehab to avoid frequent  hospitalization because it might be difficult for the family to take care of him with his significant expressive aphasia as well as apraxia (inability to carry out intended function even with normal motor strength due to inability of frontal lobe to plan the movement, etc.).   We should avoid peristroke hypertension.  We should avoid hyperthermia or infection which is known to increase the stroke size. The patient should take a multivitamin and should be considered for a flu vaccine as well as pneumococcal vaccine. GI and deep vein thrombosis prophylaxis.   Feel free to contact me with any further questions. I will follow this patient with you.  ____________________________ Hemang K. Sherryll Burger, MD hks:cb D: 05/29/2012 20:56:36 ET T: 05/29/2012 21:11:40 ET JOB#: [id number]  cc: Hemang K. Sherryll Burger, MD, <Dictator> [facility] MD ELECTRONICALLY SIGNED 06/07/2012 16:42

## 2017-06-16 DEATH — deceased
